# Patient Record
Sex: Female | Born: 2000 | Race: White | Hispanic: No | Marital: Single | Smoking: Never smoker
Health system: Southern US, Community
[De-identification: ages and names within clinical notes are randomized; demographics above are authoritative.]

---
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
## 2022-12-14 IMAGING — DX DG ANKLE COMPLETE 3+V*R*
3 series · 3 of 3 positions shown · non-contrast
Comparison: None.

CLINICAL DATA: Lateral ankle pain, rolled ankle at a trampoline
park 2 days ago

EXAM:
RIGHT ANKLE - COMPLETE 3+ VIEW

[ankle ap]
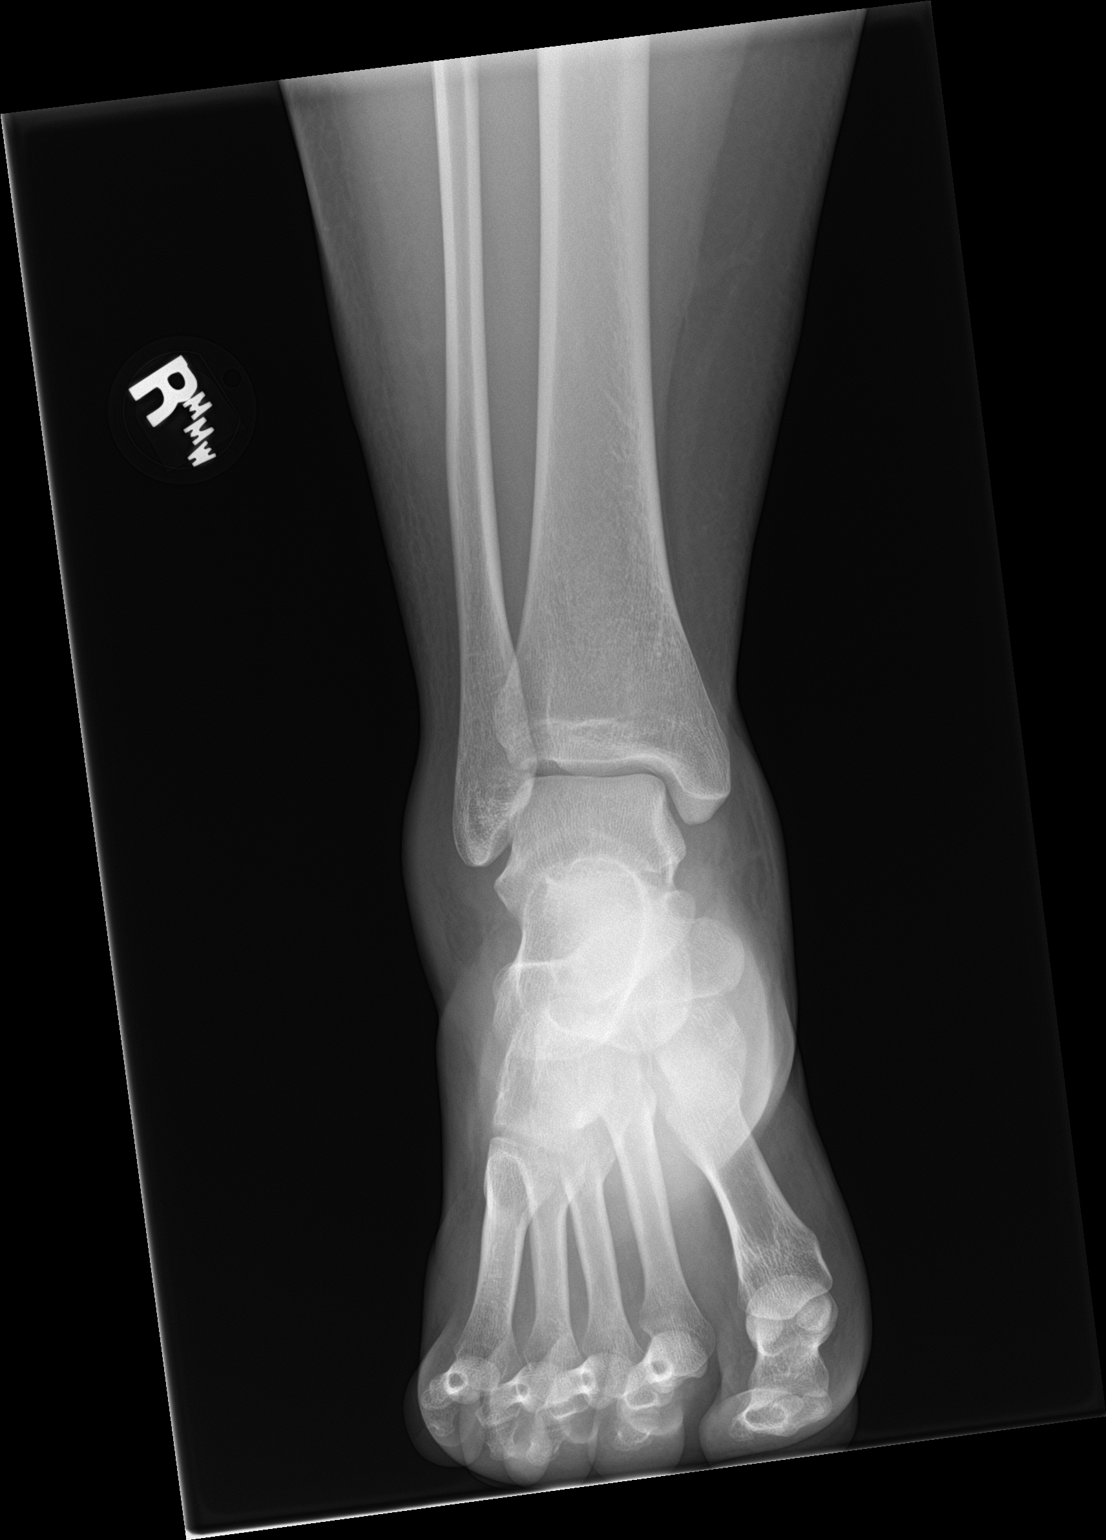

[ankle obl]
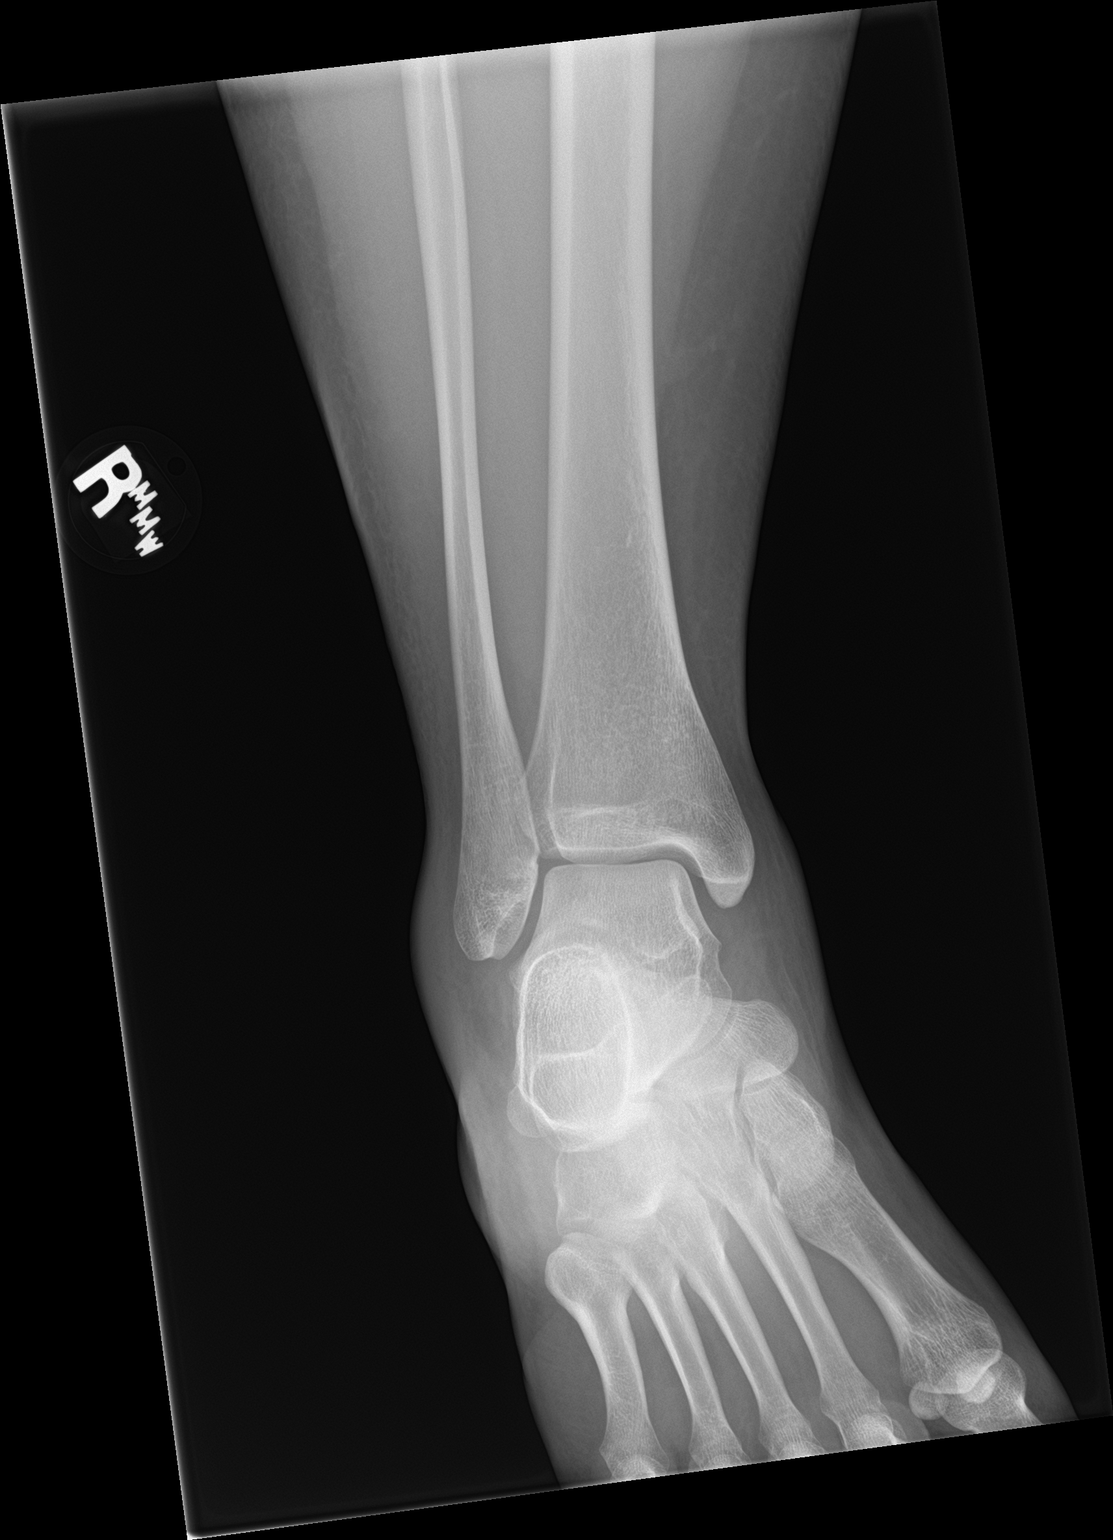

[ankle lat]
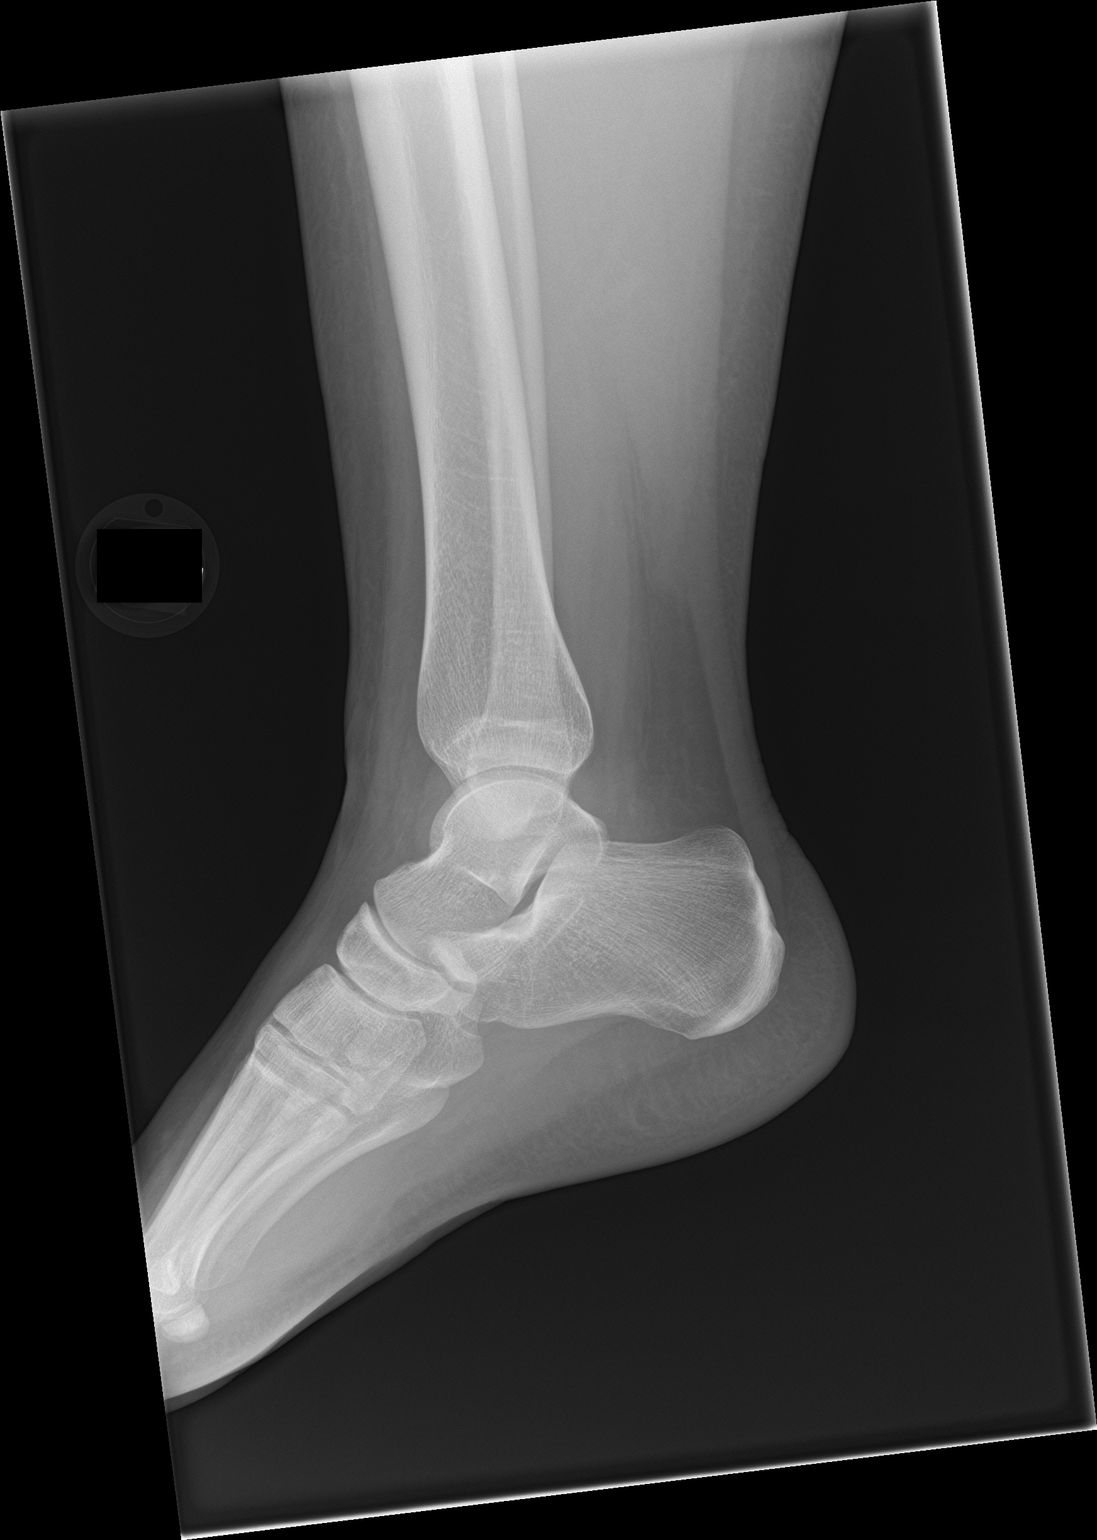

[3 of 3 positions shown; findings below may reference images not displayed]

FINDINGS: There is no evidence of fracture, dislocation, or joint effusion.
There is no evidence of arthropathy or other focal bone abnormality.
Regional soft tissue swelling.
IMPRESSION: Soft tissue swelling without acute osseous abnormality in the right
ankle.
